# Patient Record
Sex: Male | Born: 1965 | Race: Black or African American | Hispanic: No | Smoking: Current every day smoker
Health system: Southern US, Community
[De-identification: ages and names within clinical notes are randomized; demographics above are authoritative.]

## PROBLEM LIST (undated history)

## (undated) DIAGNOSIS — I10 Essential (primary) hypertension: Secondary | ICD-10-CM

## (undated) DIAGNOSIS — E119 Type 2 diabetes mellitus without complications: Secondary | ICD-10-CM

## (undated) DIAGNOSIS — G43909 Migraine, unspecified, not intractable, without status migrainosus: Secondary | ICD-10-CM

## (undated) DIAGNOSIS — J449 Chronic obstructive pulmonary disease, unspecified: Secondary | ICD-10-CM

## (undated) HISTORY — PX: NECK SURGERY: SHX720

---

## 2018-11-01 ENCOUNTER — Encounter (HOSPITAL_COMMUNITY): Payer: Self-pay

## 2018-11-01 ENCOUNTER — Emergency Department (HOSPITAL_COMMUNITY): Payer: Medicare Other

## 2018-11-01 ENCOUNTER — Other Ambulatory Visit: Payer: Self-pay

## 2018-11-01 ENCOUNTER — Emergency Department (HOSPITAL_COMMUNITY)
Admission: EM | Admit: 2018-11-01 | Discharge: 2018-11-02 | Disposition: A | Discharge status: Home / Self Care | Payer: Medicare Other | Attending: Emergency Medicine | Admitting: Emergency Medicine

## 2018-11-01 DIAGNOSIS — J449 Chronic obstructive pulmonary disease, unspecified: Secondary | ICD-10-CM | POA: Insufficient documentation

## 2018-11-01 DIAGNOSIS — R51 Headache: Secondary | ICD-10-CM | POA: Insufficient documentation

## 2018-11-01 DIAGNOSIS — R0789 Other chest pain: Secondary | ICD-10-CM | POA: Diagnosis present

## 2018-11-01 DIAGNOSIS — E119 Type 2 diabetes mellitus without complications: Secondary | ICD-10-CM | POA: Insufficient documentation

## 2018-11-01 DIAGNOSIS — R079 Chest pain, unspecified: Secondary | ICD-10-CM

## 2018-11-01 DIAGNOSIS — I10 Essential (primary) hypertension: Secondary | ICD-10-CM | POA: Insufficient documentation

## 2018-11-01 HISTORY — DX: Migraine, unspecified, not intractable, without status migrainosus: G43.909

## 2018-11-01 HISTORY — DX: Essential (primary) hypertension: I10

## 2018-11-01 HISTORY — DX: Type 2 diabetes mellitus without complications: E11.9

## 2018-11-01 HISTORY — DX: Chronic obstructive pulmonary disease, unspecified: J44.9

## 2018-11-01 LAB — BASIC METABOLIC PANEL
Anion gap: 11 (ref 5–15)
BUN: 15 mg/dL (ref 6–20)
CO2: 20 mmol/L — ABNORMAL LOW (ref 22–32)
Calcium: 9.2 mg/dL (ref 8.9–10.3)
Chloride: 108 mmol/L (ref 98–111)
Creatinine, Ser: 1.13 mg/dL (ref 0.61–1.24)
GFR calc Af Amer: >60 mL/min (ref >60)
GFR calc non Af Amer: >60 mL/min (ref >60)
Glucose, Bld: 109 mg/dL — ABNORMAL HIGH (ref 70–99)
Potassium: 3.1 mmol/L — ABNORMAL LOW (ref 3.5–5.1)
Sodium: 139 mmol/L (ref 135–145)

## 2018-11-01 LAB — CBC
HCT: 49.1 % (ref 39.0–52.0)
Hemoglobin: 16.4 g/dL (ref 13.0–17.0)
MCH: 28.5 pg (ref 26.0–34.0)
MCHC: 33.4 g/dL (ref 30.0–36.0)
MCV: 85.4 fL (ref 80.0–100.0)
Platelets: 216 10*3/uL (ref 150–400)
RBC: 5.75 MIL/uL (ref 4.22–5.81)
RDW: 13.5 % (ref 11.5–15.5)
WBC: 6.5 10*3/uL (ref 4.0–10.5)
nRBC: 0 % (ref 0.0–0.2)

## 2018-11-01 LAB — TROPONIN I (HIGH SENSITIVITY): Troponin I (High Sensitivity): 11 ng/L (ref <18)

## 2018-11-01 MED ORDER — POTASSIUM CHLORIDE CRYS ER 20 MEQ PO TBCR
40.0000 meq | EXTENDED_RELEASE_TABLET | Freq: Once | ORAL | Status: AC
  Administered 2018-11-01: 40 meq via ORAL
  Filled 2018-11-01: qty 2

## 2018-11-01 MED ORDER — ACETAMINOPHEN 500 MG PO TABS
1000.0000 mg | ORAL_TABLET | Freq: Once | ORAL | Status: AC
  Administered 2018-11-01: 1000 mg via ORAL
  Filled 2018-11-01: qty 2

## 2018-11-01 MED ORDER — SODIUM CHLORIDE 0.9% FLUSH: 3.0000 mL | Freq: Once | INTRAVENOUS | Status: DC

## 2018-11-01 MED ORDER — MORPHINE SULFATE (PF) 4 MG/ML IV SOLN: 8.0000 mg | Freq: Once | INTRAVENOUS | Status: DC

## 2018-11-01 NOTE — ED Triage Notes (Signed)
Pt comes via [facility] for CP that started at 2pm, left sided, non radiating, headache for the past two weeks, hx of migraines and recently taken of meds.

## 2018-11-01 NOTE — ED Provider Notes (Signed)
[facility] EMERGENCY [facility] Provider Note   CSN: [id number] Arrival date & time: 11/01/18  2104    History   Chief Complaint Chief Complaint  Patient presents with  . Chest Pain    HPI Pilar PlateWayne Rohl is a 53 y.o. male with history of COPD, DM, and HTN who presents to the ED via EMS complaining of left-sided chest pain that started at 2 AM this morning.  Patient reports his pain persisted throughout the day and was waxing and waning.  He reports his pain is worse with movement and deep breaths.  He describes the pain as tightness and rates it as a 10 out of 10.  He denies any associated shortness of breath, nausea, palpitations, or diaphoresis.  He is also complaining of headache for the past 2 weeks.  Patient received 324 mg of aspirin from EMS.  Patient reports he has a cardiologist who he has seen in the past and reports he has had a heart catheterization before.  Patient denies any recent fever, chills, cough, shortness of breath, abdominal pain, nausea, vomiting, diarrhea, or any other complaints.     The history is provided by the patient.    Past Medical History:  Diagnosis Date  . COPD (chronic obstructive pulmonary disease) (HCC)   . Diabetes mellitus without complication (HCC)   . Hypertension   . Migraines     There are no active problems to display for this patient.   Past Surgical History:  Procedure Laterality Date  . NECK SURGERY          Home Medications    Prior to Admission medications   Not on File    Family History No family history on file.  Social History Social History   Tobacco Use  . Smoking status: Not on file  Substance Use Topics  . Alcohol use: Not on file  . Drug use: Not on file     Allergies   Tramadol   Review of Systems Review of Systems  Constitutional: Negative for chills and fever.  HENT: Negative for ear pain and sore throat.   Eyes: Negative for pain and visual disturbance.  Respiratory:  Negative for cough and shortness of breath.   Cardiovascular: Positive for chest pain. Negative for palpitations.  Gastrointestinal: Negative for abdominal pain and vomiting.  Genitourinary: Negative for dysuria and hematuria.  Musculoskeletal: Negative for arthralgias and back pain.  Skin: Negative for color change and rash.  Neurological: Positive for headaches. Negative for seizures and syncope.  All other systems reviewed and are negative.    Physical Exam Updated Vital Signs BP 125/88 (BP Location: Right Arm)   Pulse 78   Temp 98.1 F (36.7 C) (Oral)   Resp 16   Ht 6\' 1"  (1.854 m)   Wt 109.8 kg   SpO2 96%   BMI 31.93 kg/m   Physical Exam Vitals signs and nursing note reviewed.  Constitutional:      General: He is not in acute distress.    Appearance: He is well-developed. He is not ill-appearing, toxic-appearing or diaphoretic.  HENT:     Head: Normocephalic and atraumatic.     Nose: Nose normal. No congestion or rhinorrhea.     Mouth/Throat:     Mouth: Mucous membranes are moist.     Pharynx: Oropharynx is clear. No oropharyngeal exudate or posterior oropharyngeal erythema.  Eyes:     Extraocular Movements: Extraocular movements intact.     Conjunctiva/sclera: Conjunctivae normal.  Pupils: Pupils are equal, round, and reactive to light.  Neck:     Musculoskeletal: Normal range of motion and neck supple. No neck rigidity or muscular tenderness.  Cardiovascular:     Rate and Rhythm: Normal rate and regular rhythm.     Pulses: Normal pulses.     Heart sounds: Normal heart sounds. No murmur. No friction rub. No gallop.   Pulmonary:     Effort: Pulmonary effort is normal. No respiratory distress.     Breath sounds: Normal breath sounds. No stridor. No wheezing, rhonchi or rales.  Chest:     Chest wall: Tenderness (Palpation of left anterior chest wall reproduces patient's pain) present.  Abdominal:     Palpations: Abdomen is soft.     Tenderness: There is no  abdominal tenderness.  Musculoskeletal: Normal range of motion.        General: No swelling, tenderness, deformity or signs of injury.     Right lower leg: No edema.     Left lower leg: No edema.  Skin:    General: Skin is warm and dry.  Neurological:     General: No focal deficit present.     Mental Status: He is alert and oriented to person, place, and time. Mental status is at baseline.     Cranial Nerves: No cranial nerve deficit.     Sensory: No sensory deficit.     Motor: No weakness.  Psychiatric:        Mood and Affect: Mood normal.        Behavior: Behavior normal.      ED Treatments / Results  Labs (all labs ordered are listed, but only abnormal results are displayed) Labs Reviewed  BASIC METABOLIC PANEL - Abnormal; Notable for the following components:      Result Value   Potassium 3.1 (*)    CO2 20 (*)    Glucose, Bld 109 (*)    All other components within normal limits  CBC  TROPONIN I (HIGH SENSITIVITY)  TROPONIN I (HIGH SENSITIVITY)    EKG EKG Interpretation  Date/Time:  Friday November 01 2018 21:11:00 EDT Ventricular Rate:  69 PR Interval:  130 QRS Duration: 98 QT Interval:  382 QTC Calculation: 409 R Axis:   53 Text Interpretation:  Normal sinus rhythm Possible Left atrial enlargement Left ventricular hypertrophy ST & T wave abnormality, consider inferior ischemia Abnormal ECG Confirmed by Pattricia Boss [phone]) on 11/01/2018 11:51:03 PM   Radiology Dg Chest 2 View  Result Date: 11/01/2018 CLINICAL DATA:  Chest pain short of breath EXAM: CHEST - 2 VIEW COMPARISON:  None. FINDINGS: No focal opacity or pleural effusion. Enlarged cardiomediastinal silhouette without edema. Negative for pneumothorax. IMPRESSION: No active cardiopulmonary disease.  Cardiomegaly. Electronically Signed   By: Donavan Foil M.D.   On: 11/01/2018 22:31    Procedures Procedures (including critical care time)  Medications Ordered in ED Medications  sodium chloride flush (NS)  0.9 % injection 3 mL (has no administration in time range)  acetaminophen (TYLENOL) tablet 1,000 mg (1,000 mg Oral Given 11/01/18 2340)  potassium chloride SA (K-DUR) CR tablet 40 mEq (40 mEq Oral Given 11/01/18 2340)     Initial Impression / Assessment and Plan / ED Course  I have reviewed the triage vital signs and the nursing notes.  Pertinent labs & imaging results that were available during my care of the patient were reviewed by me and considered in my medical decision making (see chart for details).  Deniece PortelaWayne Bautch is a 53 y.o. male with history of COPD, DM, and HTN who presents to the ED via EMS complaining of left-sided chest pain that started at 2 AM this morning.  Patient's chest pain is well localized and is reproducible on physical exam.  Differential diagnosis includes ACS, unstable angina, musculoskeletal chest pain, and GERD.  Chest x-ray showed cardiomegaly with no acute cardiopulmonary abnormalities.  On review of outside records, patient's left heart catheterization from 2018 showed showed mild CAD with only 10 to 20% lesions.  [facility] showed LVEF of 55% with moderate LVH.  EKG today showed isolated ST elevation in V3, which is likely due to patient's known LVH.  Patient also has T wave inversions in the inferior leads, which are noted on interpretations of prior outside EKGs in care everywhere.  These images are not available.  Patient had two negative high-sensitivity troponins while in the ED.  BMP and CBC were unremarkable.  Given the reproducibility of patient's chest pain on exam, nonischemic EKG, and two negative troponins, there is low concern for cardiac cause of patient's chest pain at this time.  On reassessment, patient reports he is feeling better and is ready to go home.  Patient was advised to follow-up closely with his cardiologist as an outpatient in the coming week.  He was advised to return to the ED for any worsening or  persistence of his chest pain.  Patient was discharged home in stable condition.  Final Clinical Impressions(s) / ED Diagnoses   Final diagnoses:  Chest pain, unspecified type    ED Discharge Orders    None       Garry HeaterEames, Dawana Asper, MD 11/02/18 [id number]    Margarita Grizzleay, Danielle, MD 11/02/18 1447

## 2018-11-02 ENCOUNTER — Emergency Department (HOSPITAL_COMMUNITY): Payer: Medicare Other

## 2018-11-02 ENCOUNTER — Encounter (HOSPITAL_COMMUNITY): Payer: Self-pay

## 2018-11-02 ENCOUNTER — Emergency Department (HOSPITAL_COMMUNITY)
Admission: EM | Admit: 2018-11-02 | Discharge: 2018-11-02 | Disposition: A | Discharge status: Home / Self Care | Payer: Medicare Other | Source: Home / Self Care | Attending: Emergency Medicine | Admitting: Emergency Medicine

## 2018-11-02 ENCOUNTER — Other Ambulatory Visit: Payer: Self-pay

## 2018-11-02 DIAGNOSIS — R0789 Other chest pain: Secondary | ICD-10-CM

## 2018-11-02 DIAGNOSIS — E119 Type 2 diabetes mellitus without complications: Secondary | ICD-10-CM | POA: Insufficient documentation

## 2018-11-02 DIAGNOSIS — J449 Chronic obstructive pulmonary disease, unspecified: Secondary | ICD-10-CM | POA: Insufficient documentation

## 2018-11-02 DIAGNOSIS — I10 Essential (primary) hypertension: Secondary | ICD-10-CM | POA: Insufficient documentation

## 2018-11-02 DIAGNOSIS — R51 Headache: Secondary | ICD-10-CM | POA: Insufficient documentation

## 2018-11-02 DIAGNOSIS — R519 Headache, unspecified: Secondary | ICD-10-CM

## 2018-11-02 DIAGNOSIS — F17210 Nicotine dependence, cigarettes, uncomplicated: Secondary | ICD-10-CM | POA: Insufficient documentation

## 2018-11-02 LAB — TROPONIN I (HIGH SENSITIVITY): Troponin I (High Sensitivity): 11 ng/L (ref <18)

## 2018-11-02 MED ORDER — DIPHENHYDRAMINE HCL 50 MG/ML IJ SOLN
25.0000 mg | Freq: Once | INTRAMUSCULAR | Status: AC
  Administered 2018-11-02: 13:00:00 25 mg via INTRAVENOUS
  Filled 2018-11-02: qty 1

## 2018-11-02 MED ORDER — METOCLOPRAMIDE HCL 5 MG/ML IJ SOLN
10.0000 mg | Freq: Once | INTRAMUSCULAR | Status: AC
  Administered 2018-11-02: 10 mg via INTRAVENOUS
  Filled 2018-11-02: qty 2

## 2018-11-02 MED ORDER — SODIUM CHLORIDE 0.9 % IV BOLUS
1000.0000 mL | Freq: Once | INTRAVENOUS | Status: AC
  Administered 2018-11-02: 1000 mL via INTRAVENOUS

## 2018-11-02 MED ORDER — KETOROLAC TROMETHAMINE 30 MG/ML IJ SOLN
30.0000 mg | Freq: Once | INTRAMUSCULAR | Status: AC
  Administered 2018-11-02: 30 mg via INTRAVENOUS
  Filled 2018-11-02: qty 1

## 2018-11-02 NOTE — ED Notes (Signed)
Patient transported to CT 

## 2018-11-02 NOTE — ED Triage Notes (Signed)
Pt from home, c/o cp and HA; evaluated and discharged 9 hours ago for same, diagnosed nonspecific cp; no new complaints; CP increasing upon palpation and inhalation, hx of same  140/100 HR 90 98% RA CBG 101

## 2018-11-02 NOTE — ED Notes (Signed)
Wife at bedside.

## 2018-11-02 NOTE — ED Provider Notes (Signed)
[facility] EMERGENCY DEPARTMENT Provider Note   CSN: [id number] Arrival date & time: 11/02/18  1107    History   Chief Complaint Chief Complaint  Patient presents with  . Chest Pain  . Headache    HPI Gregory Chandler is a 53 y.o. male.     Gregory Chandler is a 53 y.o. male history of COPD, diabetes and hypertension who presents to the ED for reevaluation of left-sided chest pain and headache.  Patient was seen in the ED last night for the same and had very reassuring work-up with 2 negative troponins, EKG without ischemicchanges and reassuring labs and chest x-ray.  During evaluation pain was found to be repeatedly reproducible with palpation.  Patient has seen a cardiologist previously and had a negative cardiac catheterization.  Patient was treated with Tylenol with improvement in his pain.  He returns today reporting continued chest pain that is no worse it continues to be well localized and worse with palpation and certain movements.  He denies associated shortness of breath.  No cough, fevers or chills.  Patient also reports a continued headache.  He reports this is a throbbing pain across the front of his head.  He denies any associated vision changes, nausea or vomiting, headache was not sudden onset, but he does report headache is severe.  He denies any numbness weakness or tingling associated with headache.  Reports slight improvement with Tylenol he received yesterday the headache has persisted.  No other aggravating or alleviating factors.  Patient is currently on Eliquis.     Past Medical History:  Diagnosis Date  . COPD (chronic obstructive pulmonary disease) ([place])   . Diabetes mellitus without complication ([place])   . Hypertension   . Migraines     There are no active problems to display for this patient.   Past Surgical History:  Procedure Laterality Date  . NECK SURGERY          Home Medications    Prior to Admission medications   Not on File    Family History No family history on file.  Social History Social History   Tobacco Use  . Smoking status: Current Every Day Smoker    Packs/day: 0.50  Substance Use Topics  . Alcohol use: Not Currently  . Drug use: Not Currently     Allergies   Tramadol   Review of Systems Review of Systems  Constitutional: Negative for chills and fever.  HENT: Negative for congestion, rhinorrhea and sore throat.   Eyes: Negative for visual disturbance.  Cardiovascular: Positive for chest pain. Negative for palpitations and leg swelling.  Gastrointestinal: Negative for abdominal pain, nausea and vomiting.  Genitourinary: Negative for dysuria and frequency.  Musculoskeletal: Negative for arthralgias, neck pain and neck stiffness.  Neurological: Positive for headaches. Negative for dizziness, syncope, facial asymmetry, speech difficulty, weakness, light-headedness and numbness.     Physical Exam Updated Vital Signs BP (!) 152/93   Pulse 81   Temp 98.2 F (36.8 C) (Oral)   Resp 20   Ht 6\' 1"  (1.854 m)   Wt 110.7 kg   SpO2 98%   BMI 32.19 kg/m   Physical Exam Vitals signs and nursing note reviewed.  Constitutional:      General: He is not in acute distress.    Appearance: He is well-developed. He is not diaphoretic.  HENT:     Head: Normocephalic and atraumatic.  Eyes:     General:        Right  eye: No discharge.        Left eye: No discharge.     Pupils: Pupils are equal, round, and reactive to light.  Neck:     Musculoskeletal: Neck supple.     Vascular: No JVD.     Trachea: No tracheal deviation.  Cardiovascular:     Rate and Rhythm: Normal rate and regular rhythm.     Pulses:          Radial pulses are 2+ on the right side and 2+ on the left side.       Dorsalis pedis pulses are 2+ on the right side and 2+ on the left side.     Heart sounds: Normal heart sounds. No murmur. No friction rub. No gallop.   Pulmonary:     Effort: Pulmonary effort is normal. No  respiratory distress.     Breath sounds: Normal breath sounds. No wheezing or rales.     Comments: Respirations equal and unlabored, patient able to speak in full sentences, lungs clear to auscultation bilaterally Chest:     Chest wall: Tenderness present.     Comments: Tenderness across the left anterior chest wall, that repeatedly reproduces pain.  No overlying skin changes. Abdominal:     General: Bowel sounds are normal. There is no distension.     Palpations: Abdomen is soft. There is no mass.     Tenderness: There is no abdominal tenderness. There is no guarding.     Comments: Abdomen soft, nondistended, nontender to palpation in all quadrants without guarding or peritoneal signs  Musculoskeletal:        General: No deformity.     Right lower leg: He exhibits no tenderness. No edema.     Left lower leg: He exhibits no tenderness. No edema.  Skin:    General: Skin is warm and dry.     Capillary Refill: Capillary refill takes less than 2 seconds.  Neurological:     Mental Status: He is alert.     Coordination: Coordination normal.     Comments: Speech is clear, able to follow commands CN III-XII intact Normal strength in upper and lower extremities bilaterally including dorsiflexion and plantar flexion, strong and equal grip strength Sensation normal to light and sharp touch Moves extremities without ataxia, coordination intact   Psychiatric:        Mood and Affect: Mood normal.        Behavior: Behavior normal.      ED Treatments / Results  Labs (all labs ordered are listed, but only abnormal results are displayed) Labs Reviewed - No data to display  EKG EKG Interpretation  Date/Time:  Saturday November 02 2018 11:26:03 EDT Ventricular Rate:  83 PR Interval:    QRS Duration: 82 QT Interval:  365 QTC Calculation: 429 R Axis:   41 Text Interpretation:  Sinus rhythm RSR' in V1 or V2, right VCD or RVH LVH with secondary repolarization abnormality No significant change  since last tracing Confirmed by Gwyneth SproutPlunkett, Whitney ([id number]) on 11/02/2018 12:23:46 PM   Radiology Dg Chest 2 View  Result Date: 11/01/2018 CLINICAL DATA:  Chest pain short of breath EXAM: CHEST - 2 VIEW COMPARISON:  None. FINDINGS: No focal opacity or pleural effusion. Enlarged cardiomediastinal silhouette without edema. Negative for pneumothorax. IMPRESSION: No active cardiopulmonary disease.  Cardiomegaly. Electronically Signed   By: Jasmine PangKim  Fujinaga M.D.   On: 11/01/2018 22:31    Procedures Procedures (including critical care time)  Medications Ordered in ED Medications  sodium chloride 0.9 % bolus 1,000 mL (1,000 mLs Intravenous New Bag/Given 11/02/18 1233)  metoCLOPramide (REGLAN) injection 10 mg (10 mg Intravenous Given 11/02/18 1236)  diphenhydrAMINE (BENADRYL) injection 25 mg (25 mg Intravenous Given 11/02/18 1233)     Initial Impression / Assessment and Plan / ED Course  I have reviewed the triage vital signs and the nursing notes.  Pertinent labs & imaging results that were available during my care of the patient were reviewed by me and considered in my medical decision making (see chart for details).  Patient presents to the emergency department for continued left-sided chest pain and headache, was evaluated for the same last night and had very reassuring cardiac work-up, EKG today does not show any acute changes and chest pain is repeatedly reproducible on exam I feel this is likely chest wall pain and I do not feel that additional cardiac work-up is warranted at this time.  Patient also continues to complain of headache which is severe, although it was gradual in onset.  Patient with no focal neurologic deficits and C of the head shows no acute abnormalities.  Patient treated in the ED with headache cocktail, and reports improvement in both his chest wall pain and headache.  At this time there does not appear to be any evidence of an acute emergency medical condition and the patient appears  stable for discharge with appropriate outpatient follow up.Diagnosis was discussed with patient who verbalizes understanding and is agreeable to discharge.   Final Clinical Impressions(s) / ED Diagnoses   Final diagnoses:  Atypical chest pain  Bad headache    ED Discharge Orders    None       Legrand RamsFord, Jak Haggar N, PA-C 11/04/18 2320    Gwyneth SproutPlunkett, Whitney, MD 11/06/18 [phone]

## 2018-11-02 NOTE — ED Notes (Signed)
Patient verbalizes understanding of discharge instructions. Opportunity for questioning and answers were provided. Armband removed by staff, pt discharged from ED.  

## 2018-11-02 NOTE — Discharge Instructions (Signed)
Your work-up today is reassuring, head CT is clear and I am glad that your symptoms have improved.  Please follow-up with your primary care doctor and cardiologist.  Return for new or worsening chest pain, shortness of breath, feeling like you may pass out, sudden worsening of headache, vision changes, numbness or weakness or any other new or concerning symptoms.

## 2018-11-02 NOTE — Discharge Instructions (Signed)
Follow-up closely with your cardiologist as soon as possible.  Return to the emergency room for any continuation or worsening of your chest pain.

## 2018-11-07 ENCOUNTER — Encounter (HOSPITAL_COMMUNITY): Payer: Self-pay

## 2018-11-07 ENCOUNTER — Emergency Department (HOSPITAL_COMMUNITY): Payer: Medicare Other

## 2018-11-07 ENCOUNTER — Emergency Department (HOSPITAL_COMMUNITY)
Admission: EM | Admit: 2018-11-07 | Discharge: 2018-11-07 | Disposition: A | Discharge status: Home / Self Care | Payer: Medicare Other | Attending: Emergency Medicine | Admitting: Emergency Medicine

## 2018-11-07 ENCOUNTER — Other Ambulatory Visit: Payer: Self-pay

## 2018-11-07 DIAGNOSIS — R079 Chest pain, unspecified: Secondary | ICD-10-CM | POA: Diagnosis present

## 2018-11-07 DIAGNOSIS — R0789 Other chest pain: Secondary | ICD-10-CM | POA: Insufficient documentation

## 2018-11-07 DIAGNOSIS — I10 Essential (primary) hypertension: Secondary | ICD-10-CM | POA: Diagnosis not present

## 2018-11-07 DIAGNOSIS — Z794 Long term (current) use of insulin: Secondary | ICD-10-CM | POA: Diagnosis not present

## 2018-11-07 DIAGNOSIS — E119 Type 2 diabetes mellitus without complications: Secondary | ICD-10-CM | POA: Insufficient documentation

## 2018-11-07 DIAGNOSIS — Z79899 Other long term (current) drug therapy: Secondary | ICD-10-CM | POA: Diagnosis not present

## 2018-11-07 DIAGNOSIS — J449 Chronic obstructive pulmonary disease, unspecified: Secondary | ICD-10-CM | POA: Insufficient documentation

## 2018-11-07 DIAGNOSIS — R519 Headache, unspecified: Secondary | ICD-10-CM

## 2018-11-07 DIAGNOSIS — F17210 Nicotine dependence, cigarettes, uncomplicated: Secondary | ICD-10-CM | POA: Diagnosis not present

## 2018-11-07 DIAGNOSIS — R51 Headache: Secondary | ICD-10-CM | POA: Insufficient documentation

## 2018-11-07 LAB — CBC WITH DIFFERENTIAL/PLATELET
Abs Immature Granulocytes: 0.02 10*3/uL (ref 0.00–0.07)
Basophils Absolute: 0 10*3/uL (ref 0.0–0.1)
Basophils Relative: 0 %
Eosinophils Absolute: 0 10*3/uL (ref 0.0–0.5)
Eosinophils Relative: 0 %
HCT: 48.2 % (ref 39.0–52.0)
Hemoglobin: 16.5 g/dL (ref 13.0–17.0)
Immature Granulocytes: 0 %
Lymphocytes Relative: 33 %
Lymphs Abs: 2 10*3/uL (ref 0.7–4.0)
MCH: 28.5 pg (ref 26.0–34.0)
MCHC: 34.2 g/dL (ref 30.0–36.0)
MCV: 83.4 fL (ref 80.0–100.0)
Monocytes Absolute: 0.4 10*3/uL (ref 0.1–1.0)
Monocytes Relative: 7 %
Neutro Abs: 3.7 10*3/uL (ref 1.7–7.7)
Neutrophils Relative %: 60 %
Platelets: 210 10*3/uL (ref 150–400)
RBC: 5.78 MIL/uL (ref 4.22–5.81)
RDW: 13.4 % (ref 11.5–15.5)
WBC: 6.1 10*3/uL (ref 4.0–10.5)
nRBC: 0 % (ref 0.0–0.2)

## 2018-11-07 LAB — BASIC METABOLIC PANEL
Anion gap: 10 (ref 5–15)
BUN: 11 mg/dL (ref 6–20)
CO2: 24 mmol/L (ref 22–32)
Calcium: 9.1 mg/dL (ref 8.9–10.3)
Chloride: 106 mmol/L (ref 98–111)
Creatinine, Ser: 0.98 mg/dL (ref 0.61–1.24)
GFR calc Af Amer: >60 mL/min (ref >60)
GFR calc non Af Amer: >60 mL/min (ref >60)
Glucose, Bld: 111 mg/dL — ABNORMAL HIGH (ref 70–99)
Potassium: 3.4 mmol/L — ABNORMAL LOW (ref 3.5–5.1)
Sodium: 140 mmol/L (ref 135–145)

## 2018-11-07 LAB — D-DIMER, QUANTITATIVE: D-Dimer, Quant: <0.27 ug/mL-FEU (ref 0.00–0.50)

## 2018-11-07 LAB — TROPONIN I (HIGH SENSITIVITY)
Troponin I (High Sensitivity): 6 ng/L (ref <18)
Troponin I (High Sensitivity): 8 ng/L (ref <18)

## 2018-11-07 MED ORDER — ACETAMINOPHEN ER 650 MG PO TBCR: 650.0000 mg | EXTENDED_RELEASE_TABLET | Freq: Three times a day (TID) | ORAL | 0 refills | Status: AC | PRN

## 2018-11-07 MED ORDER — HYDRALAZINE HCL 25 MG PO TABS: 50.0000 mg | ORAL_TABLET | Freq: Once | ORAL | Status: DC

## 2018-11-07 MED ORDER — DIPHENHYDRAMINE HCL 50 MG/ML IJ SOLN
25.0000 mg | Freq: Once | INTRAMUSCULAR | Status: AC
  Administered 2018-11-07: 14:00:00 25 mg via INTRAVENOUS
  Filled 2018-11-07: qty 1

## 2018-11-07 MED ORDER — METOCLOPRAMIDE HCL 5 MG/ML IJ SOLN
10.0000 mg | Freq: Once | INTRAMUSCULAR | Status: AC
  Administered 2018-11-07: 10 mg via INTRAVENOUS
  Filled 2018-11-07: qty 2

## 2018-11-07 MED ORDER — KETOROLAC TROMETHAMINE 30 MG/ML IJ SOLN
30.0000 mg | Freq: Once | INTRAMUSCULAR | Status: AC
  Administered 2018-11-07: 16:00:00 30 mg via INTRAVENOUS
  Filled 2018-11-07: qty 1

## 2018-11-07 NOTE — ED Notes (Signed)
Left without his last med and he did not want his discharge papers

## 2018-11-07 NOTE — ED Notes (Signed)
Pt woke up c/o a headache

## 2018-11-07 NOTE — ED Provider Notes (Signed)
[facility] Provider Note   CSN: [id number] Arrival date & time: 11/07/18  1220    History   Chief Complaint Chief Complaint  Patient presents with  . Chest Pain    HPI Pilar PlateWayne Chandler is a 53 y.o. male.     53 y.o male with a PMH of COPD, HTN,  DM presents to the ED via EMS with a chief complaint of headache, chest pain, shortness of breath x 1week. Patient has had two visit to the ED last week for the same complaint, reports he was encouraged to follow-up with PCP, cardiology.  Reports he saw cardiology this week on Tuesday who increased his blood pressure medication, has not taken his blood pressure medication today.  Endorses a frontal sharp head pain with no radiation.  No photophobia, has taken some ibuprofen, Tylenol, Excedrin without improvement in symptoms.  According to his chart, patient was seen by pulmonologist this morning for emphysema.  According to his chart he is currently followed by cardiologist locally, chart states "He had his last cardiac cath in 2018 which did not show any significant occlusions around the heart. It also did not show pulmonary hypertension. His EF was predicted to be at 55".  He had shown up to his appointment hypertensive, reports he did not take his medication this morning.  He does not have any previous history of blood clots, denies any fever, vomiting, nausea.  Has had some changes in medication.  The history is provided by the patient and medical records.  Chest Pain Associated symptoms: headache and shortness of breath   Associated symptoms: no abdominal pain, no back pain, no cough, no fever, no palpitations and no vomiting     Past Medical History:  Diagnosis Date  . COPD (chronic obstructive pulmonary disease) (HCC)   . Diabetes mellitus without complication (HCC)   . Hypertension   . Migraines     There are no active problems to display for this patient.   Past Surgical History:  Procedure  Laterality Date  . NECK SURGERY          Home Medications    Prior to Admission medications   Medication Sig Start Date End Date Taking? Authorizing Provider  acetaminophen (TYLENOL) 500 MG tablet Take 1,000 mg by mouth every 6 (six) hours as needed for mild pain.   Yes [provider]  amitriptyline (ELAVIL) 25 MG tablet Take 25 mg by mouth at bedtime. 09/11/14  Yes [provider]  amLODipine (NORVASC) 10 MG tablet Take 10 mg by mouth daily.   Yes [provider]  aspirin-acetaminophen-caffeine (EXCEDRIN MIGRAINE) 351-308-9039250-250-65 MG tablet Take 2 tablets by mouth every 6 (six) hours as needed for headache.   Yes [provider]  dicyclomine (BENTYL) 10 MG capsule Take 10 mg by mouth 4 (four) times daily as needed for spasms.   Yes [provider]  DILT-XR 180 MG 24 hr capsule Take 180 mg by mouth daily. 06/05/18  Yes [provider]  divalproex (DEPAKOTE ER) 500 MG 24 hr tablet Take 1,000 mg by mouth at bedtime.   Yes [provider]  ELIQUIS 5 MG TABS tablet Take 5 mg by mouth 2 (two) times daily. 10/02/18  Yes [provider]  hydrALAZINE (APRESOLINE) 50 MG tablet Take 50 mg by mouth 2 (two) times daily. 10/02/18  Yes [provider]  insulin aspart (NOVOLOG FLEXPEN) 100 UNIT/ML FlexPen Inject 0-12 Units into the skin 3 (three) times daily before  meals. 08/12/17  Yes [provider]  Insulin Detemir (LEVEMIR FLEXTOUCH) 100 UNIT/ML Pen Inject 20 Units into the skin every morning. 01/08/18  Yes [provider]  MULTAQ 400 MG tablet Take 400 mg by mouth 2 (two) times daily. 10/02/18  Yes [provider]  oxyCODONE-acetaminophen (PERCOCET) 10-325 MG tablet Take 1 tablet by mouth 4 (four) times daily as needed for pain. 10/21/18  Yes [provider]  pantoprazole (PROTONIX) 40 MG tablet Take 40 mg by mouth daily.   Yes [provider]  pregabalin (LYRICA) 150 MG capsule Take 150 mg  by mouth every 8 (eight) hours as needed (pain).   Yes [provider]  Pseudoephedrine-Ibuprofen (IBUPROFEN COLD & SINUS PO) Take 1 tablet by mouth daily as needed (sinus headache).   Yes [provider]  rosuvastatin (CRESTOR) 40 MG tablet Take 40 mg by mouth daily.   Yes [provider]  torsemide (DEMADEX) 20 MG tablet Take 20 mg by mouth daily.   Yes [provider]  umeclidinium-vilanterol (ANORO ELLIPTA) 62.5-25 MCG/INH AEPB Inhale 1 puff into the lungs daily.   Yes [provider]    Family History History reviewed. No pertinent family history.  Social History Social History   Tobacco Use  . Smoking status: Current Every Day Smoker    Packs/day: 0.50  . Smokeless tobacco: Never Used  Substance Use Topics  . Alcohol use: Not Currently  . Drug use: Not Currently     Allergies   Tramadol   Review of Systems Review of Systems  Constitutional: Negative for chills and fever.  HENT: Negative for ear pain and sore throat.   Eyes: Negative for pain and visual disturbance.  Respiratory: Positive for shortness of breath. Negative for cough.   Cardiovascular: Positive for chest pain. Negative for palpitations.  Gastrointestinal: Negative for abdominal pain and vomiting.  Genitourinary: Negative for dysuria and hematuria.  Musculoskeletal: Negative for arthralgias and back pain.  Skin: Negative for color change and rash.  Neurological: Positive for headaches. Negative for seizures and syncope.  All other systems reviewed and are negative.    Physical Exam Updated Vital Signs BP (!) 143/101   Pulse 88   Resp 16   Ht 6\' 1"  (1.854 m)   Wt 110.7 kg   SpO2 95%   BMI 32.19 kg/m   Physical Exam Vitals signs and nursing note reviewed.  Constitutional:      Appearance: He is well-developed.  HENT:     Head: Normocephalic and atraumatic.  Eyes:     General: No scleral icterus.    Pupils: Pupils are equal, round, and reactive  to light.  Neck:     Musculoskeletal: Normal range of motion.  Cardiovascular:     Heart sounds: Normal heart sounds.  Pulmonary:     Effort: Pulmonary effort is normal.     Breath sounds: Examination of the right-upper field reveals decreased breath sounds. Examination of the right-middle field reveals decreased breath sounds. Examination of the left-middle field reveals decreased breath sounds. Examination of the right-lower field reveals decreased breath sounds. Examination of the left-lower field reveals decreased breath sounds. Decreased breath sounds present. No wheezing.  Chest:     Chest wall: No tenderness.  Abdominal:     General: Bowel sounds are normal. There is no distension.     Palpations: Abdomen is soft.     Tenderness: There is no abdominal tenderness.  Musculoskeletal:        General: No tenderness  or deformity.  Skin:    General: Skin is warm and dry.  Neurological:     Mental Status: He is alert and oriented to person, place, and time.     Comments: Alert, oriented, thought content appropriate. Speech fluent without evidence of aphasia. Able to follow 2 step commands without difficulty.  Cranial Nerves:  II:  Peripheral visual fields grossly normal, pupils, round, reactive to light III,IV, VI: ptosis not present, extra-ocular motions intact bilaterally  V,VII: smile symmetric, facial light touch sensation equal VIII: hearing grossly normal bilaterally  IX,X: midline uvula rise  XI: bilateral shoulder shrug equal and strong XII: midline tongue extension  Motor:  5/5 in upper and lower extremities bilaterally including strong and equal grip strength and dorsiflexion/plantar flexion Sensory: light touch normal in all extremities.  Cerebellar: normal finger-to-nose with bilateral upper extremities, pronator drift negative Gait: normal gait and balance       ED Treatments / Results  Labs (all labs ordered are listed, but only abnormal results are displayed)  Labs Reviewed  BASIC METABOLIC PANEL - Abnormal; Notable for the following components:      Result Value   Potassium 3.4 (*)    Glucose, Bld 111 (*)    All other components within normal limits  CBC WITH DIFFERENTIAL/PLATELET  D-DIMER, QUANTITATIVE (NOT AT [facility])  TROPONIN I (HIGH SENSITIVITY)  TROPONIN I (HIGH SENSITIVITY)    EKG EKG Interpretation  Date/Time:  Thursday November 07 2018 12:22:31 EDT Ventricular Rate:  103 PR Interval:    QRS Duration: 92 QT Interval:  315 QTC Calculation: 413 R Axis:   35 Text Interpretation:  Sinus tachycardia Probable left atrial enlargement LVH with secondary repolarization abnormality new lateral twi compared with prior 8/20 Confirmed by Meridee ScoreButler, Michael [phone]([id number]) on 11/07/2018 12:25:57 PM   Radiology Dg Chest 2 View  Result Date: 11/07/2018 CLINICAL DATA:  Chest pain and shortness of breath EXAM: CHEST - 2 VIEW COMPARISON:  November 01, 2018 FINDINGS: There is no edema or consolidation. Heart is upper normal in size with pulmonary vascularity normal. No adenopathy. No bone lesions. IMPRESSION: No edema or consolidation.  Heart upper normal in size. Electronically Signed   By: Bretta BangWilliam  Woodruff III M.D.   On: 11/07/2018 13:17    Procedures Procedures (including critical care time)  Medications Ordered in ED Medications  ketorolac (TORADOL) 30 MG/ML injection 30 mg (has no administration in time range)  metoCLOPramide (REGLAN) injection 10 mg (10 mg Intravenous Given 11/07/18 1332)  diphenhydrAMINE (BENADRYL) injection 25 mg (25 mg Intravenous Given 11/07/18 1332)     Initial Impression / Assessment and Plan / ED Course  I have reviewed the triage vital signs and the nursing notes.  Pertinent labs & imaging results that were available during my care of the patient were reviewed by me and considered in my medical decision making (see chart for details).  Clinical Course as of Nov 07 1542  Thu Nov 07, 2018  [id number] 53 year old male here with  ongoing chest pain tightness left-sided.  Elevated blood pressures.  He has been seen 2 other times last week for same.  Also complains of headache which is similar to her other visits.  Otherwise nontoxic-appearing.  Getting lab work EKG   [MB]    Clinical Course User Index [MB] Terrilee FilesButler, Michael C, MD       Patient with multiple visits to the ED last week for the same complaint.  Reports she had a frontal headache which began about a week  ago.  Has seen PCP, cardiology, pulmonologist and had some changes in medication.  Arrived at pulmonologist office today hypertensive, states he had not taken his blood pressure medication.   Arrived in the ED hyper tensive, with chest pressure via EMS.  Had relief with nitro along with aspirin.  States he had taken his blood pressure medication while in the parking lot.  Pressure here was 142/105, he is nontoxic-appearing, not diaphoretic, no nausea or vomiting.  According to pulmonologist note patient had a cath in 2018 without any occlusions.  He was given headache cocktail for his symptoms, CBC was unremarkable.  BMP show slight decrease in potassium, otherwise within normal limits and a normal creatinine.  A dimer was obtained as patient reports driving back and forth from multiple locations [facility], dimer level was negative.  First troponin was normal.  EKG was consistent with his previous visits.  A chest x-ray showed no consolidation, pneumothorax, pleural effusion.  Results were discussed with patient at length, encouraged to follow-up with PCP.  Given an extra dose of Toradol IV to help with his headache.  Patient was also given a prescription for tylenol 650 to help with his symptoms.  He reports seeing a neurologist yesterday which started him on medication for his headaches.  He reports taking 1 dose of this medication yesterday.  Encouraged to continue taking this medication.  Patient will be discharged after Toradol is given.  With  stable vital signs, patient stable for discharge.   Portions of this note were generated with Scientist, clinical (histocompatibility and immunogenetics)Dragon dictation software. Dictation errors may occur despite best attempts at proofreading.   Final Clinical Impressions(s) / ED Diagnoses   Final diagnoses:  Atypical chest pain  Intractable episodic headache, unspecified headache type  Essential hypertension    ED Discharge Orders    None       Claude MangesSoto, Syniah Berne, PA-C 11/07/18 1551    Terrilee FilesButler, Michael C, MD 11/07/18 [phone]

## 2018-11-07 NOTE — Discharge Instructions (Addendum)
Your laboratory results were within normal limits.  Your chest x-ray was within normal limits.  Please continue to take your blood pressure medication as prescribed, you may follow-up with primary care as needed.

## 2018-11-07 NOTE — ED Triage Notes (Signed)
Patient arrived via [facility] with c/o of chest pain x1 week. Today patient had central chest tightness that radiates to the left side of chest and some pressure. Patient had no change in pain after receiving Nitro 0.4 x3 from ems. Patient also received 324 asprin.  Hx of HTN. Patient states at this time he has a headache that is 10/10 with chest pressure.

## 2018-11-07 NOTE — ED Notes (Signed)
Sleeping soundly 

## 2018-11-07 NOTE — ED Notes (Signed)
Pt unhappy about the treatment today.  He reports that he still has a headache  He also appeared to have a racial bias  Only wanted to be treated by african [place] staff  Frequent pt here in the ed

## 2019-02-10 ENCOUNTER — Emergency Department (HOSPITAL_COMMUNITY)
Admission: EM | Admit: 2019-02-10 | Discharge: 2019-02-10 | Disposition: A | Discharge status: Home / Self Care | Payer: Medicare Other | Attending: Emergency Medicine | Admitting: Emergency Medicine

## 2019-02-10 ENCOUNTER — Other Ambulatory Visit: Payer: Self-pay

## 2019-02-10 ENCOUNTER — Encounter (HOSPITAL_COMMUNITY): Payer: Self-pay | Admitting: Emergency Medicine

## 2019-02-10 ENCOUNTER — Emergency Department (HOSPITAL_COMMUNITY): Payer: Medicare Other

## 2019-02-10 DIAGNOSIS — Z794 Long term (current) use of insulin: Secondary | ICD-10-CM | POA: Diagnosis not present

## 2019-02-10 DIAGNOSIS — M545 Low back pain: Secondary | ICD-10-CM | POA: Insufficient documentation

## 2019-02-10 DIAGNOSIS — G8929 Other chronic pain: Secondary | ICD-10-CM | POA: Insufficient documentation

## 2019-02-10 DIAGNOSIS — F17210 Nicotine dependence, cigarettes, uncomplicated: Secondary | ICD-10-CM | POA: Insufficient documentation

## 2019-02-10 DIAGNOSIS — E119 Type 2 diabetes mellitus without complications: Secondary | ICD-10-CM | POA: Diagnosis not present

## 2019-02-10 DIAGNOSIS — J449 Chronic obstructive pulmonary disease, unspecified: Secondary | ICD-10-CM | POA: Diagnosis not present

## 2019-02-10 DIAGNOSIS — Z79899 Other long term (current) drug therapy: Secondary | ICD-10-CM | POA: Diagnosis not present

## 2019-02-10 DIAGNOSIS — I10 Essential (primary) hypertension: Secondary | ICD-10-CM | POA: Diagnosis not present

## 2019-02-10 LAB — URINALYSIS, ROUTINE W REFLEX MICROSCOPIC
Bilirubin Urine: NEGATIVE
Glucose, UA: NEGATIVE mg/dL
Hgb urine dipstick: NEGATIVE
Ketones, ur: NEGATIVE mg/dL
Leukocytes,Ua: NEGATIVE
Nitrite: NEGATIVE
Protein, ur: NEGATIVE mg/dL
Specific Gravity, Urine: 1.016 (ref 1.005–1.030)
pH: 7 (ref 5.0–8.0)

## 2019-02-10 MED ORDER — PREGABALIN 100 MG PO CAPS
200.0000 mg | ORAL_CAPSULE | Freq: Once | ORAL | Status: AC
  Administered 2019-02-10: 200 mg via ORAL
  Filled 2019-02-10: qty 2

## 2019-02-10 MED ORDER — LIDOCAINE 5 % EX PTCH
1.0000 | MEDICATED_PATCH | CUTANEOUS | Status: DC
  Administered 2019-02-10: 1 via TRANSDERMAL
  Filled 2019-02-10: qty 1

## 2019-02-10 MED ORDER — CYCLOBENZAPRINE HCL 10 MG PO TABS: 10.0000 mg | ORAL_TABLET | Freq: Two times a day (BID) | ORAL | 0 refills | Status: DC | PRN

## 2019-02-10 MED ORDER — HYDROMORPHONE HCL 1 MG/ML IJ SOLN
1.0000 mg | Freq: Once | INTRAMUSCULAR | Status: AC
  Administered 2019-02-10: 10:00:00 1 mg via INTRAMUSCULAR
  Filled 2019-02-10: qty 1

## 2019-02-10 MED ORDER — PREDNISONE 20 MG PO TABS
60.0000 mg | ORAL_TABLET | Freq: Once | ORAL | Status: AC
  Administered 2019-02-10: 60 mg via ORAL
  Filled 2019-02-10: qty 3

## 2019-02-10 MED ORDER — CYCLOBENZAPRINE HCL 10 MG PO TABS
10.0000 mg | ORAL_TABLET | Freq: Once | ORAL | Status: AC
  Administered 2019-02-10: 10:00:00 10 mg via ORAL
  Filled 2019-02-10: qty 1

## 2019-02-10 MED ORDER — ACETAMINOPHEN 500 MG PO TABS
1000.0000 mg | ORAL_TABLET | Freq: Once | ORAL | Status: AC
  Administered 2019-02-10: 10:00:00 1000 mg via ORAL
  Filled 2019-02-10: qty 2

## 2019-02-10 MED ORDER — METHYLPREDNISOLONE 4 MG PO TBPK: ORAL_TABLET | ORAL | 0 refills | Status: AC

## 2019-02-10 MED ORDER — CYCLOBENZAPRINE HCL 10 MG PO TABS: 10.0000 mg | ORAL_TABLET | Freq: Three times a day (TID) | ORAL | 0 refills | Status: AC

## 2019-02-10 NOTE — ED Triage Notes (Signed)
Pt arrives via ems from home with c/o of back pain lower , states had  Hx neck surgery and they took a bone from his hip , pt states just moved here from [place]  A month ago and sees a pain clinic there but has not set up with one here yet, pt denies incontinence,   Or numbness or tingling , takes lyica and  percocet  But they have not helped , denies any  Injury or lifting he states

## 2019-02-10 NOTE — ED Provider Notes (Signed)
[facility] EMERGENCY DEPARTMENT Provider Note   CSN: [id number] Arrival date & time: 02/10/19  0802     History   Chief Complaint Chief Complaint  Patient presents with   Back Pain    HPI Gregory Chandler is a 53 y.o. male with pertinent PMH of hypertension, tobacco use, diabetes, nonobstructive CAD, chronic back pain on pain management presents to the ER for evaluation of acute on chronic back pain.  Reports chronic back pain for at least 15 to 20 years located in the low back.  His pain worsened this past Friday.  Described as constant, sharp, 10/10, nonradiating localized in the lumbar middle area.  Worse with standing up and standing up straight, states he cannot walk.  Eases off if he lays still.  Feels similar to his chronic pain but significantly worse and has never been this bad before.  His pain clinic is in [facility].  He sees Dr. Jomarie Longs monthly for refills.  He was last there on 10/24 and has an appointment on 11/25 for refills.  He takes Lyrica twice daily and Percocets every 4 hours.  His last dose of medicines was last night and he has not taken anything today.  States he would have driven to [place] this morning but felt like the pain was too bad so he called EMS for evaluation in the ER.  He moved to [place] 1 month ago and is in temporary housing.  Denies any falls or trauma.  Denies any precipitating events such as lifting twisting, sleeping position changes.  No associated fever, abdominal pain, chest pain, distal tingling or loss of sensation or weakness although states lifting his legs is harder due to the pain in his back.  No urinary symptoms.  No changes in his bowel movements.  No saddle anesthesia.  No loss of bladder or bowel control.   HPI  Past Medical History:  Diagnosis Date   COPD (chronic obstructive pulmonary disease) (HCC)    Diabetes mellitus without complication (HCC)    Hypertension    Migraines     There are no  active problems to display for this patient.   Past Surgical History:  Procedure Laterality Date   NECK SURGERY          Home Medications    Prior to Admission medications   Medication Sig Start Date End Date Taking? Authorizing Provider  amitriptyline (ELAVIL) 25 MG tablet Take 25 mg by mouth at bedtime. 09/11/14   [provider]  amLODipine (NORVASC) 10 MG tablet Take 10 mg by mouth daily.    [provider]  aspirin-acetaminophen-caffeine (EXCEDRIN MIGRAINE) [phone] MG tablet Take 2 tablets by mouth every 6 (six) hours as needed for headache.    [provider]  cyclobenzaprine (FLEXERIL) 10 MG tablet Take 1 tablet (10 mg total) by mouth 3 (three) times daily. 02/10/19   Liberty Handy, PA-C  dicyclomine (BENTYL) 10 MG capsule Take 10 mg by mouth 4 (four) times daily as needed for spasms.    [provider]  DILT-XR 180 MG 24 hr capsule Take 180 mg by mouth daily. 06/05/18   [provider]  divalproex (DEPAKOTE ER) 500 MG 24 hr tablet Take 1,000 mg by mouth at bedtime.    [provider]  ELIQUIS 5 MG TABS tablet Take 5 mg by mouth 2 (two) times daily. 10/02/18   [provider]  hydrALAZINE (APRESOLINE) 50 MG tablet Take 50 mg by mouth 2 (two)  times daily. 10/02/18   [provider]  insulin aspart (NOVOLOG FLEXPEN) 100 UNIT/ML FlexPen Inject 0-12 Units into the skin 3 (three) times daily before meals. 08/12/17   [provider]  Insulin Detemir (LEVEMIR FLEXTOUCH) 100 UNIT/ML Pen Inject 20 Units into the skin every morning. 01/08/18   [provider]  methylPREDNISolone (MEDROL DOSEPAK) 4 MG TBPK tablet Finish taper as instructed 02/10/19   Liberty HandyGibbons, Laniesha Das J, PA-C  MULTAQ 400 MG tablet Take 400 mg by mouth 2 (two) times daily. 10/02/18   [provider]  oxyCODONE-acetaminophen (PERCOCET) 10-325 MG tablet Take 1 tablet by mouth 4 (four) times daily as needed for pain. 10/21/18    [provider]  pantoprazole (PROTONIX) 40 MG tablet Take 40 mg by mouth daily.    [provider]  pregabalin (LYRICA) 150 MG capsule Take 150 mg by mouth every 8 (eight) hours as needed (pain).    [provider]  Pseudoephedrine-Ibuprofen (IBUPROFEN COLD & SINUS PO) Take 1 tablet by mouth daily as needed (sinus headache).    [provider]  rosuvastatin (CRESTOR) 40 MG tablet Take 40 mg by mouth daily.    [provider]  torsemide (DEMADEX) 20 MG tablet Take 20 mg by mouth daily.    [provider]  umeclidinium-vilanterol (ANORO ELLIPTA) 62.5-25 MCG/INH AEPB Inhale 1 puff into the lungs daily.    [provider]    Family History No family history on file.  Social History Social History   Tobacco Use   Smoking status: Current Every Day Smoker    Packs/day: 0.50   Smokeless tobacco: Never Used  Substance Use Topics   Alcohol use: Not Currently   Drug use: Not Currently     Allergies   Tramadol   Review of Systems Review of Systems  Musculoskeletal: Positive for back pain.  All other systems reviewed and are negative.    Physical Exam Updated Vital Signs BP (!) 154/102 (BP Location: Right Arm)    Pulse 63    Temp 98 F (36.7 C) (Oral)    Resp 18    SpO2 98%   Physical Exam Constitutional:      General: He is not in acute distress.    Appearance: He is well-developed.  HENT:     Head: Normocephalic and atraumatic.     Nose: Nose normal.  Neck:     Comments: c-spine: no midline or paraspinal tenderness Cardiovascular:     Rate and Rhythm: Normal rate.     Pulses:          Radial pulses are 2+ on the right side and 2+ on the left side.       Dorsalis pedis pulses are 2+ on the right side and 2+ on the left side.     Heart sounds: Normal heart sounds.  Pulmonary:     Effort: Pulmonary effort is normal.     Breath sounds: Normal breath sounds.  Abdominal:     Palpations: Abdomen is soft.      Tenderness: There is no abdominal tenderness.     Comments: No suprapubic or CVA tenderness. Soft. No distention. No pulsatility   Musculoskeletal:        General: Tenderness present.     Lumbar back: He exhibits tenderness and pain.     Comments: T-spine: no midline or paraspinal tenderness L-spine: midline and right sided lumbar muscle tenderness over large old healed surgical scar above SI joint.  No sciatic notch tenderness.  Bilateral SLR reproduces lumbar back pain, no radicular symptoms.  Pelvis: no pain or crepitus with IR/ER/downward pressure of hips bilaterally. No AP/L instability noted with compression. No leg shortening or rotation.    Skin:    General: Skin is warm and dry.     Capillary Refill: Capillary refill takes less than 2 seconds.     Comments: No overlaying rash to back   Neurological:     Mental Status: He is alert.     Sensory: No sensory deficit.     Comments: Able to SLR lift/hold with pain but no unilateral weakness or drift 5/5 strength with flexion/extension of hip, knee and ankle, bilaterally.  Sensation to light touch intact in lower extremities including feet  Psychiatric:        Behavior: Behavior normal.        Thought Content: Thought content normal.      ED Treatments / Results  Labs (all labs ordered are listed, but only abnormal results are displayed) Labs Reviewed  URINALYSIS, ROUTINE W REFLEX MICROSCOPIC    EKG None  Radiology Dg Lumbar Spine Complete  Result Date: 02/10/2019 CLINICAL DATA:  Acute on chronic low back pain, no trauma. EXAM: LUMBAR SPINE - COMPLETE 4+ VIEW COMPARISON:  Report from lumbar spine radiographs 06/06/2015 (images unavailable), MRI of the lumbar spine 06/04/2015 (images unavailable). FINDINGS: Five lumbar vertebrae. Vertebral body height is maintained. No more than mild multilevel disc height loss. Prominent L4-L5 and L5-S1 facet degenerative arthrosis. No definite pars interarticularis defect demonstrated on  oblique radiographs. IMPRESSION: No compression fracture. Mild multilevel disc height loss. Prominent facet degenerative arthrosis at L4-L5 and L5-S1. Electronically Signed   By: Kellie Simmering DO   On: 02/10/2019 10:20    Procedures Procedures (including critical care time)  Medications Ordered in ED Medications  HYDROmorphone (DILAUDID) injection 1 mg (1 mg Intramuscular Given 02/10/19 0945)  acetaminophen (TYLENOL) tablet 1,000 mg (1,000 mg Oral Given 02/10/19 0944)  pregabalin (LYRICA) capsule 200 mg (200 mg Oral Given 02/10/19 0944)  cyclobenzaprine (FLEXERIL) tablet 10 mg (10 mg Oral Given 02/10/19 0944)  predniSONE (DELTASONE) tablet 60 mg (60 mg Oral Given 02/10/19 1127)     Initial Impression / Assessment and Plan / ED Course  I have reviewed the triage vital signs and the nursing notes.  Pertinent labs & imaging results that were available during my care of the patient were reviewed by me and considered in my medical decision making (see chart for details).  Highest on ddx is muscular strain vs spasm vs bulging disc.  MSK shows reproducible midline/right sided lumbar tenderness.  Significant pain with standing up but strength is intact.  Sensation intact. Abdominal exam benign, without pulsatility, suprapubic or CVA tenderness. Distal pulses symmetric bilaterally. No overlaying rash.   No red flag features of back pain such as saddle anesthesia, bladder/bowel incontinence or retention, fevers, h/o cancer, IVDU, preceding trauma or falls, neuro deficits, urinary symptoms.  He voided urine here without issues.    Reviewed EMR to obtain pertinent pmh, no recent imaging.  No preceding trauma but give acute on chronic onset, age, will obtain screening x-rays.    Given level of pain, will give pain control here, reassess.   1924: Urinalysis obtained in triage reviewed by me with no RBCs or signs of infection.  Clinical suspicion for GU process such as UTI, pyelonephritis, renal stone  is highly unlikely because he has no symptoms of this urinalysis normal.  Lumbar x-ray is nonacute.  Patient  was reassessed after pain medicines here and had moderate improvement in pain.  I personally ambulated patient who sat up stood up and ambulated in the room with slow cautious gait but no unsteadiness.  Given the improvement in symptoms, benign work-up thus far I do not think further emergent work-up is indicated.  No clinical signs or symptoms to suggest UTI/pyelo, kidney stone, cauda equina, epidural abscess, dissection as these don't fit clinical picture. No indication for higher level of imaging at this time like MR/CT.    Will DC with Flexeril, Medrol Dosepak, lidocaine patches.  He has prescription for Percocet and Lyrica.  Recommended close follow-up with pain clinic for further pain management.  Return precautions discussed.  Patient is comfortable this.  Final Clinical Impressions(s) / ED Diagnoses   Final diagnoses:  Chronic midline low back pain without sciatica    ED Discharge Orders         Ordered    cyclobenzaprine (FLEXERIL) 10 MG tablet  2 times daily PRN,   Status:  Discontinued     02/10/19 1118    methylPREDNISolone (MEDROL DOSEPAK) 4 MG TBPK tablet     02/10/19 1118    cyclobenzaprine (FLEXERIL) 10 MG tablet  3 times daily     02/10/19 1119           Liberty Handy, [place] 02/10/19 1926    Derwood Kaplan, MD 02/11/19 1557

## 2019-02-10 NOTE — Discharge Instructions (Signed)
You were seen in the ER for back pain.  X-ray showed degenerative changes in your lumbar spine but no acute bone abnormalities or fractures.  Your pain improved after medicines here.  Take your prescribed Percocet and Lyrica as usual.  I have given you a prescription for a muscle relaxer 10 mg cyclobenzaprine that you can take every 8 hours.  Finish methylprednisolone pack to help with nerve inflammation and irritation.  Over-the-counter lidocaine patches 4% can be applied to the skin and switch to every 12 hours.  Apply heat to the area.  Call your pain clinic to schedule a sooner appointment, they may offer other modalities to control your pain.  Return to the ER for worsening or severe pain despite medicines, fever, abdominal pain, distal tingling weakness or loss of sensation, groin numbness or difficulty with urination or bowel movements.

## 2020-08-31 IMAGING — CT CT HEAD WITHOUT CONTRAST
4 series · 17 of 47 positions shown, 19 images · non-contrast
Comparison: None.

CLINICAL DATA: 53-year-old male with acute severe headache.

EXAM:
CT HEAD WITHOUT CONTRAST
TECHNIQUE: Contiguous axial images were obtained from the base of the skull
through the vertex without intravenous contrast.

[Series 3: head without · axial · non-contrast · 0.43mm/px · z∈[-87,+38]mm · 7 of 35 slices shown, 9 images]
[im 5/35  brain]
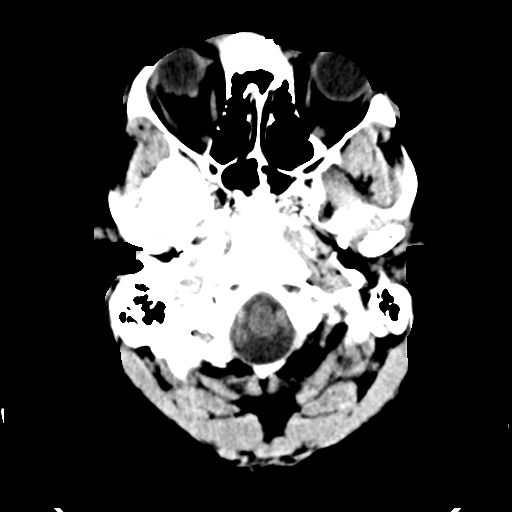
[im 5/35  bone]
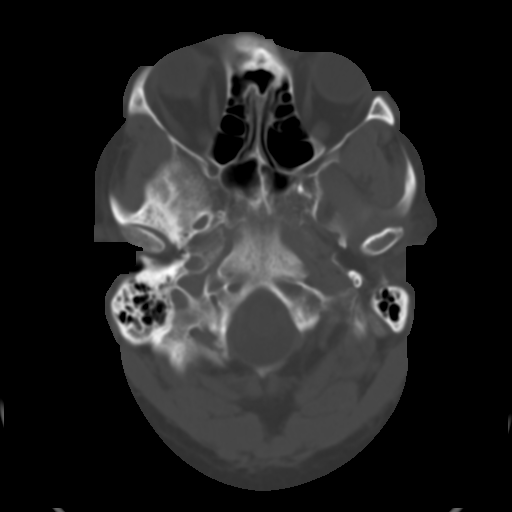
[im 9/35  brain]
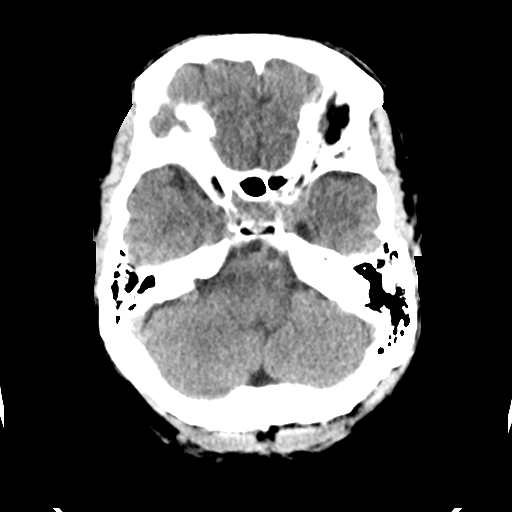
[im 13/35  brain]
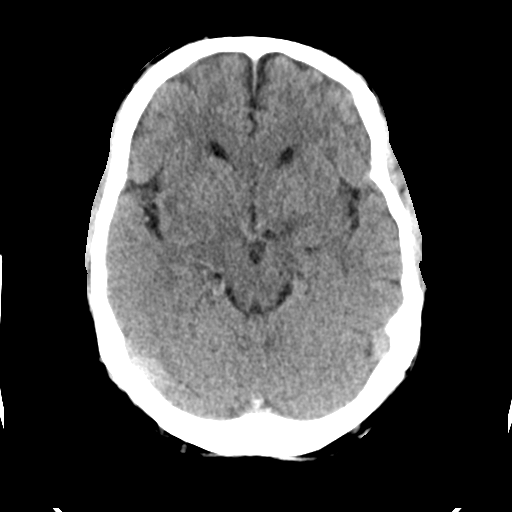
[im 18/35  brain]
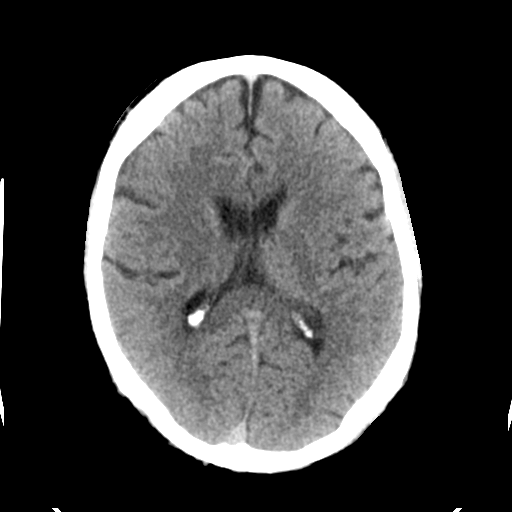
[im 22/35  brain]
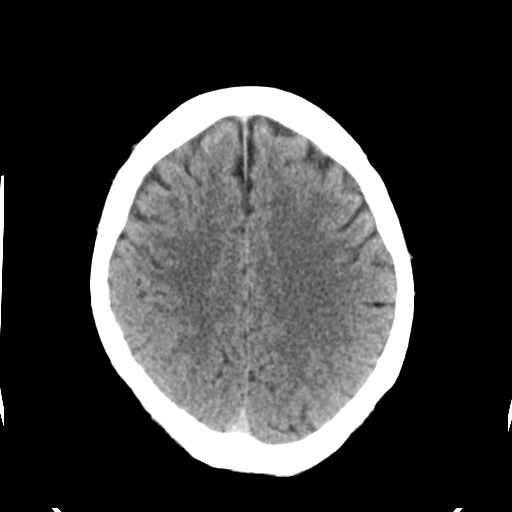
[im 22/35  bone]
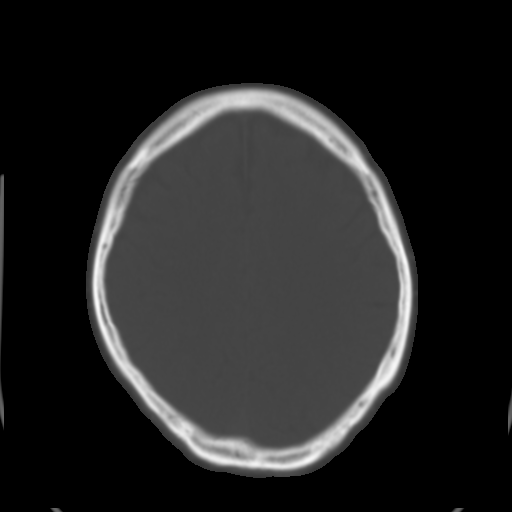
[im 26/35  brain]
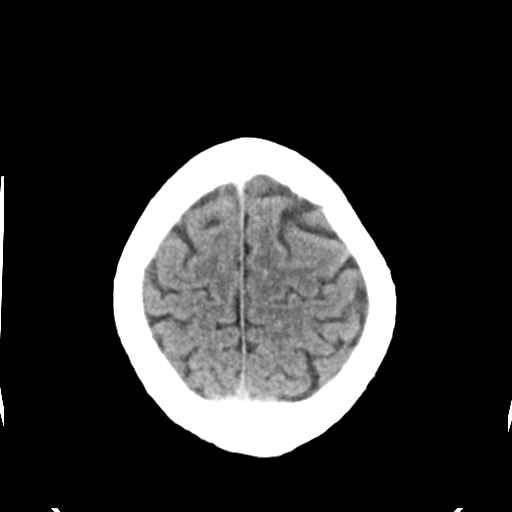
[im 30/35  brain]
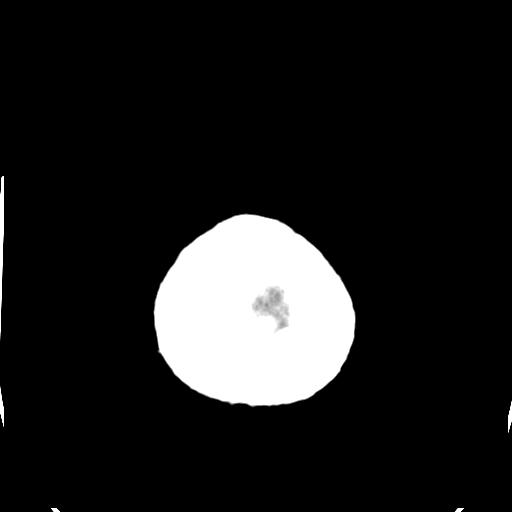

[Series 4: head bone · axial · 0.43mm/px · z∈[-91,-31]mm · 4 of 87 slices shown]
[im 9/87  bone]
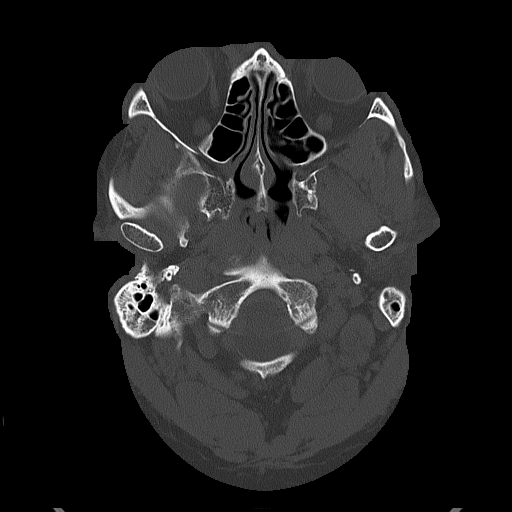
[im 18/87  bone]
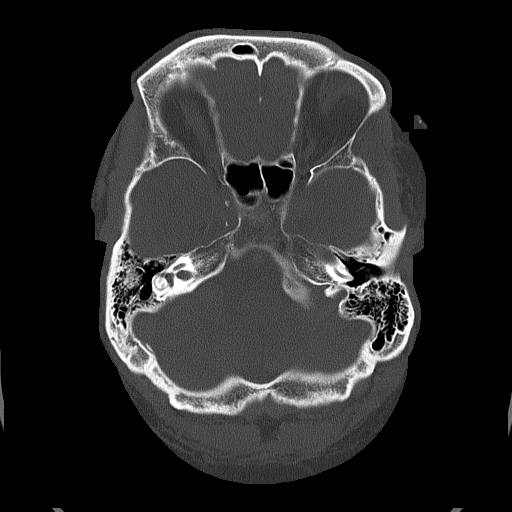
[im 26/87  bone]
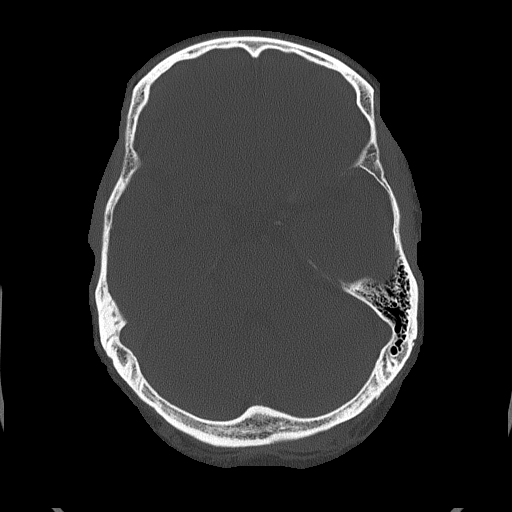
[im 39/87  bone]
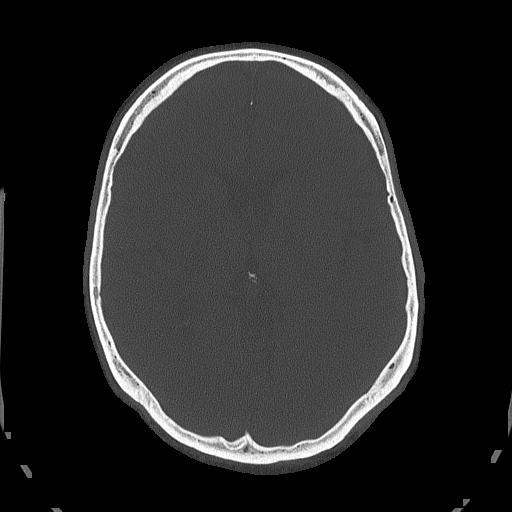

[Series 5: head without cor · coronal · non-contrast · 0.31mm/px · 3 of 65 slices shown]
[im 22/65  brain]
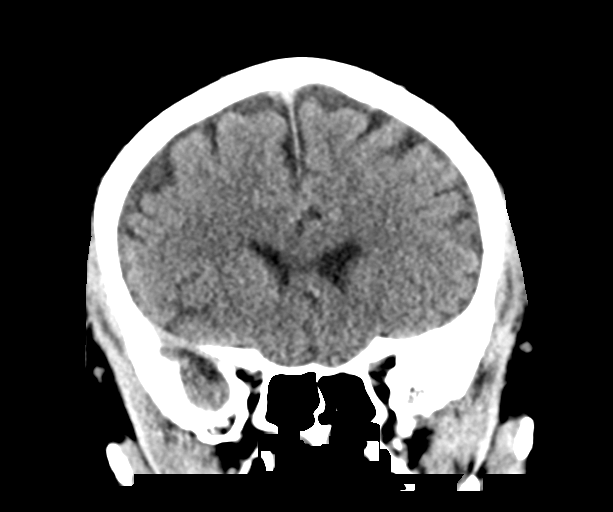
[im 29/65  brain]
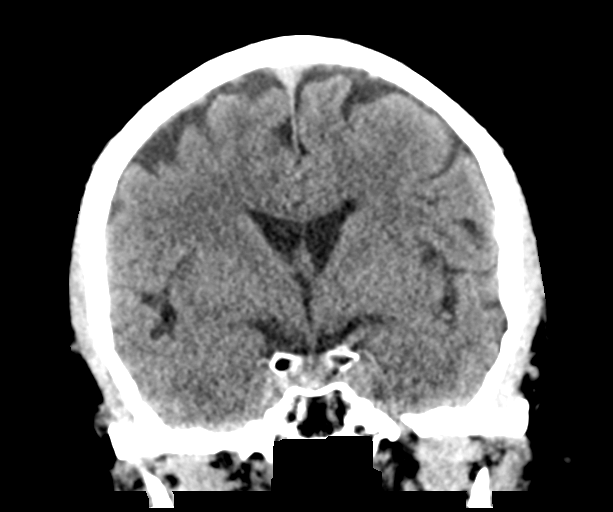
[im 36/65  brain]
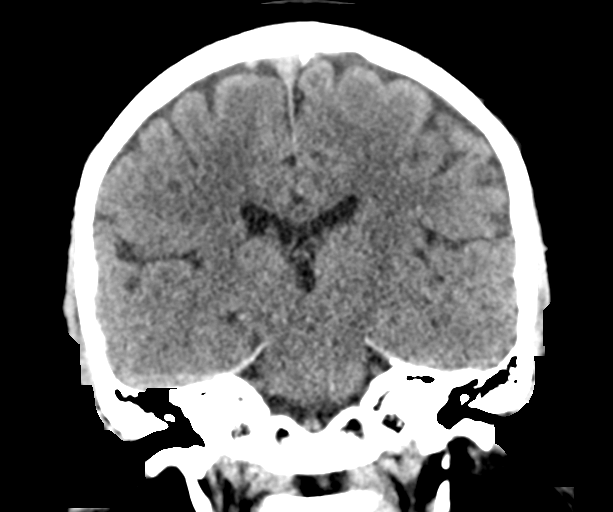

[Series 6: head without sag · sagittal · non-contrast · 0.33mm/px · 3 of 58 slices shown]
[im 20/58  brain]
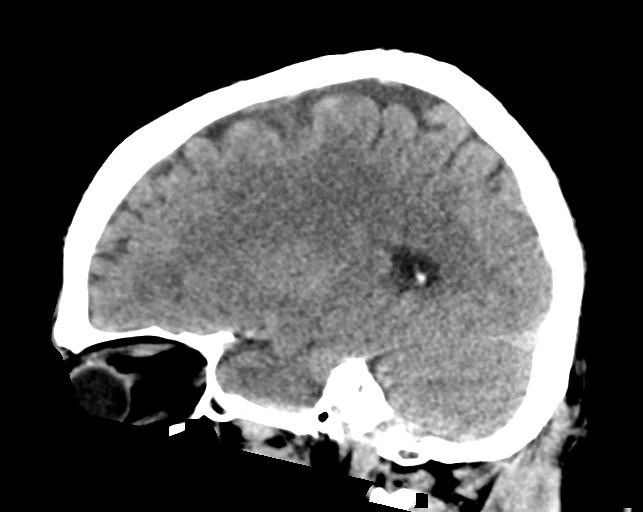
[im 29/58  brain]
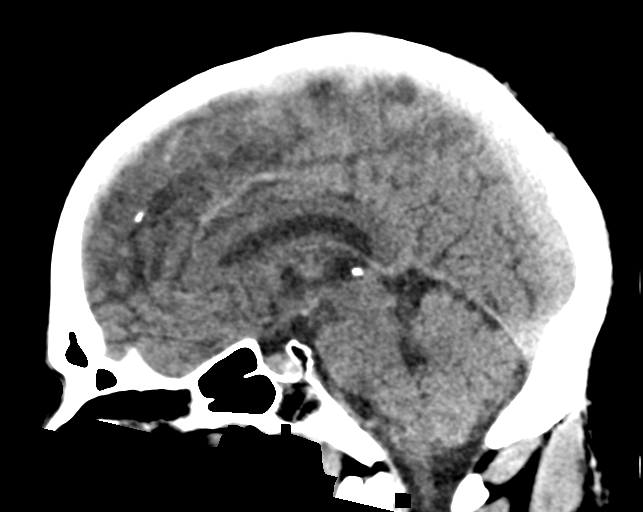
[im 39/58  brain]
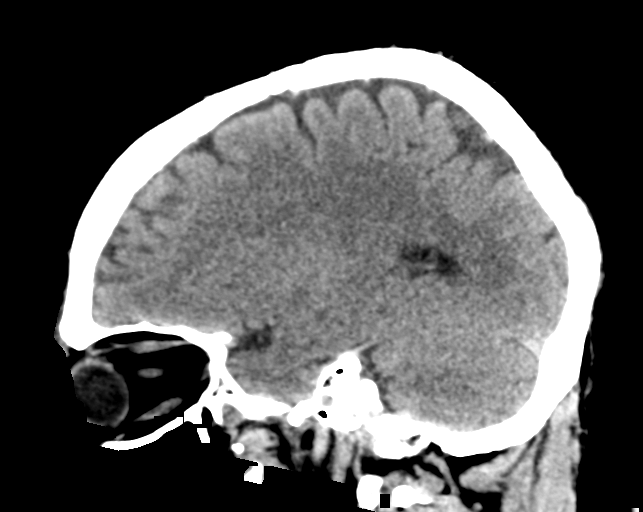

[17 of 47 positions shown; findings below may reference images not displayed]

FINDINGS: Brain: No evidence of acute infarction, hemorrhage, hydrocephalus,
extra-axial collection or mass lesion/mass effect.

Vascular: No hyperdense vessel or unexpected calcification.

Skull: Normal. Negative for fracture or focal lesion.

Sinuses/Orbits: No acute finding.

Other: None.
IMPRESSION: Unremarkable noncontrast head CT.
